# Patient Record
Sex: Male | Born: 1975 | Race: Black or African American | Hispanic: No | Marital: Single | Smoking: Never smoker
Health system: Southern US, Community
[De-identification: ages and names within clinical notes are randomized; demographics above are authoritative.]

---

## 2013-01-31 ENCOUNTER — Other Ambulatory Visit: Payer: Self-pay | Admitting: Infectious Disease

## 2013-01-31 ENCOUNTER — Ambulatory Visit
Admission: RE | Admit: 2013-01-31 | Discharge: 2013-01-31 | Disposition: A | Discharge status: Home / Self Care | Payer: No Typology Code available for payment source | Source: Ambulatory Visit | Attending: Infectious Disease | Admitting: Infectious Disease

## 2013-01-31 DIAGNOSIS — A150 Tuberculosis of lung: Secondary | ICD-10-CM

## 2016-03-26 ENCOUNTER — Ambulatory Visit (INDEPENDENT_AMBULATORY_CARE_PROVIDER_SITE_OTHER): Payer: Commercial Managed Care - PPO | Admitting: Physician Assistant

## 2016-03-26 VITALS — BP 112/73 | HR 72 | Temp 99.2°F | Resp 14 | Ht 67.0 in | Wt 144.0 lb

## 2016-03-26 DIAGNOSIS — M79602 Pain in left arm: Secondary | ICD-10-CM

## 2016-03-26 MED ORDER — ACETAMINOPHEN 500 MG PO TABS: 500.0000 mg | ORAL_TABLET | Freq: Four times a day (QID) | ORAL | 0 refills | Status: AC | PRN

## 2016-03-26 NOTE — Progress Notes (Signed)
Francis Perez  MRN: [id number] DOB: 11/02/1975  Subjective:  Francis Perez is a 41 y.o. male seen in office today for a chief complaint of intermittent left arm pain x 4 years. Denies acute injury, neck pain, numbness, tingling, weakness, and decreased ROM. Pain is maybe worsened with cold weather. He cannot think of any other aggravating symptoms. Notes that he will maybe have one episode per month that will last about half the day. He has never tried anything for the pain because it will just go away. He is not having the pain today. Of note he is a very active individual. He is always lifting/pushing/pulling things at work but notes working does not make it worse. He also does structured exercise and stretching regularly. He notes he can run 2 hours on treadmill with no issues. Denies history of exertional chest pain, SOB, nausea, vomiting, and diaphoresis.  Denies smoking.  Has no FM heart disease.  Review of Systems  Musculoskeletal: Negative for arthralgias, back pain, gait problem and myalgias.    There are no active problems to display for this patient.   No current outpatient prescriptions on file prior to visit.   No current facility-administered medications on file prior to visit.     Allergies not on file   Objective:  BP 112/73   Pulse 72   Temp 99.2 F (37.3 C) (Oral)   Resp 14   Ht 5\' 7"  (1.702 m)   Wt 144 lb (65.3 kg)   SpO2 100%   BMI 22.55 kg/m   Physical Exam  Constitutional: He is oriented to person, place, and time and well-developed, well-nourished, and in no distress.  HENT:  Head: Normocephalic and atraumatic.  Eyes: Conjunctivae are normal.  Neck: Normal range of motion and full passive range of motion without pain. No spinous process tenderness and no muscular tenderness present.  Cardiovascular: Normal rate, regular rhythm, normal heart sounds and intact distal pulses.   Pulmonary/Chest: Effort normal.  Musculoskeletal:       Left shoulder: Normal. He  exhibits normal range of motion, no tenderness, no bony tenderness, no swelling and no spasm.       Left elbow: Normal. He exhibits normal range of motion and no swelling. No tenderness found.       Left wrist: He exhibits normal range of motion, no tenderness, no bony tenderness and no swelling.       Cervical back: Normal. He exhibits normal range of motion, no tenderness and no bony tenderness.       Left upper arm: Normal. He exhibits no tenderness and no bony tenderness.       Left forearm: He exhibits no tenderness and no bony tenderness.       Left hand: He exhibits normal range of motion, no tenderness, no bony tenderness and normal capillary refill. Normal strength noted.  Neurological: He is alert and oriented to person, place, and time. He has normal strength. Gait normal.  Reflex Scores:      Tricep reflexes are 2+ on the right side and 2+ on the left side.      Bicep reflexes are 2+ on the right side and 2+ on the left side.      Brachioradialis reflexes are 2+ on the right side and 2+ on the left side. Skin: Skin is warm and dry.  Psychiatric: Affect normal.  Vitals reviewed.   Assessment and Plan :  1. Pain of left upper extremity PE findings are reassuring. Pt instructed  to keep an arm pain journal and document future incidents of arm and reporting what he is doing at the time, what the weather was like, any associated symptoms and bring that to his next visit as he could not provide a good thorough history today. Given him a Rx for tylenol to try next time he has pain to see if this helps. Encouraged to continue daily stretching. Instructed to return to clinic if symptoms worsen, do not improve, or as needed - acetaminophen (TYLENOL) 500 MG tablet; Take 1 tablet (500 mg total) by mouth every 6 (six) hours as needed.  Dispense: 30 tablet; Refill: 0   Benjiman Core PA-C  [facility] 03/26/2016 12:40 PM

## 2016-03-26 NOTE — Patient Instructions (Addendum)
For the couple of months, I would like you to keep a journal of when your arm pain appears. Note in the journal what is going on when the pain happens, what the weather is like, are there any other associated symptoms? Bring this in to your next visit. You can also try to take tylenol next time you are having this pain and see if it helps.   I think you should make a follow up appointment in 1-2 months for annual physical exam so we can recheck your blood work and make sure everything is good! Thank you!    IF you received an x-ray today, you will receive an [facility] from [facility]. Please contact [facility] at [phone] with questions or concerns regarding your invoice.   IF you received labwork today, you will receive an invoice from [facility]. Please contact [facility] at [phone] with questions or concerns regarding your invoice.   Our billing staff will not be able to assist you with questions regarding bills from these companies.  You will be contacted with the lab results as soon as they are available. The fastest way to get your results is to activate your My Chart account. Instructions are located on the last page of this paperwork. If you have not heard from us regarding the results in 2 weeks, please contact this office.

## 2016-07-22 ENCOUNTER — Encounter (HOSPITAL_COMMUNITY): Payer: Self-pay | Admitting: Emergency Medicine

## 2016-07-22 ENCOUNTER — Emergency Department (HOSPITAL_COMMUNITY)
Admission: EM | Admit: 2016-07-22 | Discharge: 2016-07-22 | Disposition: A | Discharge status: Home / Self Care | Payer: Commercial Managed Care - PPO | Attending: Emergency Medicine | Admitting: Emergency Medicine

## 2016-07-22 ENCOUNTER — Emergency Department (HOSPITAL_COMMUNITY): Payer: Commercial Managed Care - PPO

## 2016-07-22 DIAGNOSIS — M79602 Pain in left arm: Secondary | ICD-10-CM | POA: Diagnosis not present

## 2016-07-22 DIAGNOSIS — R079 Chest pain, unspecified: Secondary | ICD-10-CM

## 2016-07-22 DIAGNOSIS — R0789 Other chest pain: Secondary | ICD-10-CM | POA: Diagnosis not present

## 2016-07-22 DIAGNOSIS — M542 Cervicalgia: Secondary | ICD-10-CM | POA: Insufficient documentation

## 2016-07-22 LAB — CBC
HCT: 42 % (ref 39.0–52.0)
Hemoglobin: 14.4 g/dL (ref 13.0–17.0)
MCH: 30.5 pg (ref 26.0–34.0)
MCHC: 34.3 g/dL (ref 30.0–36.0)
MCV: 89 fL (ref 78.0–100.0)
PLATELETS: 285 10*3/uL (ref 150–400)
RBC: 4.72 MIL/uL (ref 4.22–5.81)
RDW: 12.7 % (ref 11.5–15.5)
WBC: 5.5 10*3/uL (ref 4.0–10.5)

## 2016-07-22 LAB — COMPREHENSIVE METABOLIC PANEL
ALT: 22 U/L (ref 17–63)
AST: 22 U/L (ref 15–41)
Albumin: 4.3 g/dL (ref 3.5–5.0)
Alkaline Phosphatase: 54 U/L (ref 38–126)
Anion gap: 8 (ref 5–15)
BUN: 11 mg/dL (ref 6–20)
CHLORIDE: 104 mmol/L (ref 101–111)
CO2: 27 mmol/L (ref 22–32)
CREATININE: 0.67 mg/dL (ref 0.61–1.24)
Calcium: 9.6 mg/dL (ref 8.9–10.3)
GFR calc non Af Amer: >60 mL/min (ref >60)
Glucose, Bld: 103 mg/dL — ABNORMAL HIGH (ref 65–99)
Potassium: 3.8 mmol/L (ref 3.5–5.1)
SODIUM: 139 mmol/L (ref 135–145)
Total Bilirubin: 0.7 mg/dL (ref 0.3–1.2)
Total Protein: 8.3 g/dL — ABNORMAL HIGH (ref 6.5–8.1)

## 2016-07-22 LAB — I-STAT TROPONIN, ED: Troponin i, poc: 0 ng/mL (ref 0.00–0.08)

## 2016-07-22 MED ORDER — ASPIRIN 81 MG PO CHEW: 324.0000 mg | CHEWABLE_TABLET | Freq: Once | ORAL | Status: DC

## 2016-07-22 NOTE — ED Provider Notes (Signed)
[facility]-EMERGENCY DEPT Provider Note   CSN: [id number] Arrival date & time: 07/22/16  1107     History   Chief Complaint Chief Complaint  Patient presents with  . Arm Pain  . Flank Pain    HPI Francis Perez is a 41 y.o. male.  HPI  41 year old male in no pertinent past medical history presents to the ED with 2 days of constant left-sided chest, left arm, left neck and face pain. Nonexertional. No associated nausea, vomiting, shortness of breath, diaphoresis. No aggravating or alleviating factors. Patient reports that he has felt this pain for 4 years and has been recurring since. Typically this pain lasts for 3-4 days and then spontaneously resolves. He denies any recent fevers, illnesses, infections, trauma. He does not do any heavy lifting. Denies any recent travels or surgeries.   History reviewed. No pertinent past medical history.  There are no active problems to display for this patient.   History reviewed. No pertinent surgical history.     Home Medications    Prior to Admission medications   Medication Sig Start Date End Date Taking? Authorizing Provider  acetaminophen (TYLENOL) 500 MG tablet Take 1 tablet (500 mg total) by mouth every 6 (six) hours as needed. Patient not taking: Reported on 07/22/2016 03/26/16   Magdalene River, PA-C    Family History History reviewed. No pertinent family history.  Social History Social History  Substance Use Topics  . Smoking status: Never Smoker  . Smokeless tobacco: Never Used  . Alcohol use Not on file     Allergies   Patient has no known allergies.   Review of Systems Review of Systems All other systems are reviewed and are negative for acute change except as noted in the HPI  Physical Exam Updated Vital Signs BP 122/70   Pulse 75   Temp 98.1 F (36.7 C) (Oral)   Resp (!) 24   SpO2 100%   Physical Exam  Constitutional: He is oriented to person, place, and time. He appears well-developed and  well-nourished. No distress.  HENT:  Head: Normocephalic and atraumatic.  Nose: Nose normal.  Eyes: Pupils are equal, round, and reactive to light. Conjunctivae and EOM are normal. Right eye exhibits no discharge. Left eye exhibits no discharge. No scleral icterus.  Neck: Normal range of motion. Neck supple.  Cardiovascular: Normal rate and regular rhythm.  Exam reveals no gallop and no friction rub.   No murmur heard. Pulmonary/Chest: Effort normal and breath sounds normal. No stridor. No respiratory distress. He has no rales.  Abdominal: Soft. He exhibits no distension. There is no tenderness.  Musculoskeletal: He exhibits no edema or tenderness.  Neurological: He is alert and oriented to person, place, and time.  Skin: Skin is warm and dry. No rash noted. He is not diaphoretic. No erythema.  Psychiatric: He has a normal mood and affect.  Vitals reviewed.    ED Treatments / Results  Labs (all labs ordered are listed, but only abnormal results are displayed) Labs Reviewed  COMPREHENSIVE METABOLIC PANEL - Abnormal; Notable for the following:       Result Value   Glucose, Bld 103 (*)    Total Protein 8.3 (*)    All other components within normal limits  CBC  I-STAT TROPONIN, ED    EKG  EKG Interpretation  Date/Time:  Thursday July 22 2016 12:18:05 EDT Ventricular Rate:  66 PR Interval:    QRS Duration: 120 QT Interval:  374 QTC Calculation: 392 R Axis:  65 Text Interpretation:  Sinus rhythm IVCD, consider atypical RBBB ST elev, probable normal early repol pattern No STEMI No old tracing to compare Confirmed by Drema Pryardama, Pedro [id number]) on 07/22/2016 12:51:16 PM       Radiology Dg Chest 2 View  Result Date: 07/22/2016 CLINICAL DATA:  Chest pain. EXAM: CHEST  2 VIEW COMPARISON:  01/31/2013 . FINDINGS: Mediastinum hilar structures normal. Lungs are clear. Heart size normal. No pleural effusion or pneumothorax. IMPRESSION: No acute cardiopulmonary disease. Electronically  Signed   By: Maisie Fushomas  Register   On: 07/22/2016 12:48    Procedures Procedures (including critical care time)  Medications Ordered in ED Medications  aspirin chewable tablet 324 mg (not administered)     Initial Impression / Assessment and Plan / ED Course  I have reviewed the triage vital signs and the nursing notes.  Pertinent labs & imaging results that were available during my care of the patient were reviewed by me and considered in my medical decision making (see chart for details).  Clinical Course as of Jul 22 1333  Thu Jul 22, 2016  1333 Atypical chest pain highly inconsistent with ACS. EKG without acute ischemic changes or evidence of pericarditis. Troponin negative. Given the duration of the patient's constant pain, I feel that the single troponin is sufficient to rule out ACS at this time.  Low pretest probability for pulmonary embolism and PERC negative. Presentation not classic for aortic dissection or esophageal perforation.  Chest x-ray without evidence suggestive of pneumonia, pneumothorax, pneumomediastinum.  No abnormal contour of the mediastinum to suggest dissection. No evidence of acute injuries.  The patient is safe for discharge with strict return precautions.   [PC]    Clinical Course User Index [PC] Cardama, Amadeo GarnetPedro Eduardo, MD      Final Clinical Impressions(s) / ED Diagnoses   Final diagnoses:  Chest pain  Left sided chest pain  Left arm pain  Neck pain on left side   Disposition: Discharge  Condition: Good  I have discussed the results, Dx and Tx plan with the patient who expressed understanding and agree(s) with the plan. Discharge instructions discussed at great length. The patient was given strict return precautions who verbalized understanding of the instructions. No further questions at time of discharge.    New Prescriptions   No medications on file    Follow Up: primary care provider   For help establishing care with a care  provider      Nira Connardama, Pedro Eduardo, MD 07/22/16 1336

## 2016-07-22 NOTE — ED Triage Notes (Signed)
Pt requests eval for left sided pain intermittently in legs arms, flank and back x weeks

## 2016-07-22 NOTE — ED Notes (Signed)
Patient transported to X-ray 

## 2017-07-28 ENCOUNTER — Ambulatory Visit: Payer: Commercial Managed Care - PPO | Admitting: Urgent Care

## 2017-07-28 DIAGNOSIS — Z01118 Encounter for examination of ears and hearing with other abnormal findings: Secondary | ICD-10-CM | POA: Diagnosis not present

## 2017-07-28 DIAGNOSIS — Z136 Encounter for screening for cardiovascular disorders: Secondary | ICD-10-CM | POA: Diagnosis not present

## 2017-07-28 DIAGNOSIS — Z131 Encounter for screening for diabetes mellitus: Secondary | ICD-10-CM | POA: Diagnosis not present

## 2017-07-28 DIAGNOSIS — Z0000 Encounter for general adult medical examination without abnormal findings: Secondary | ICD-10-CM | POA: Diagnosis not present

## 2018-11-12 IMAGING — DX DG CHEST 2V
2 series · 2 of 2 positions shown · non-contrast
Comparison: 01/31/2013 .

CLINICAL DATA: Chest pain.

EXAM:
CHEST  2 VIEW

[w chest pa]
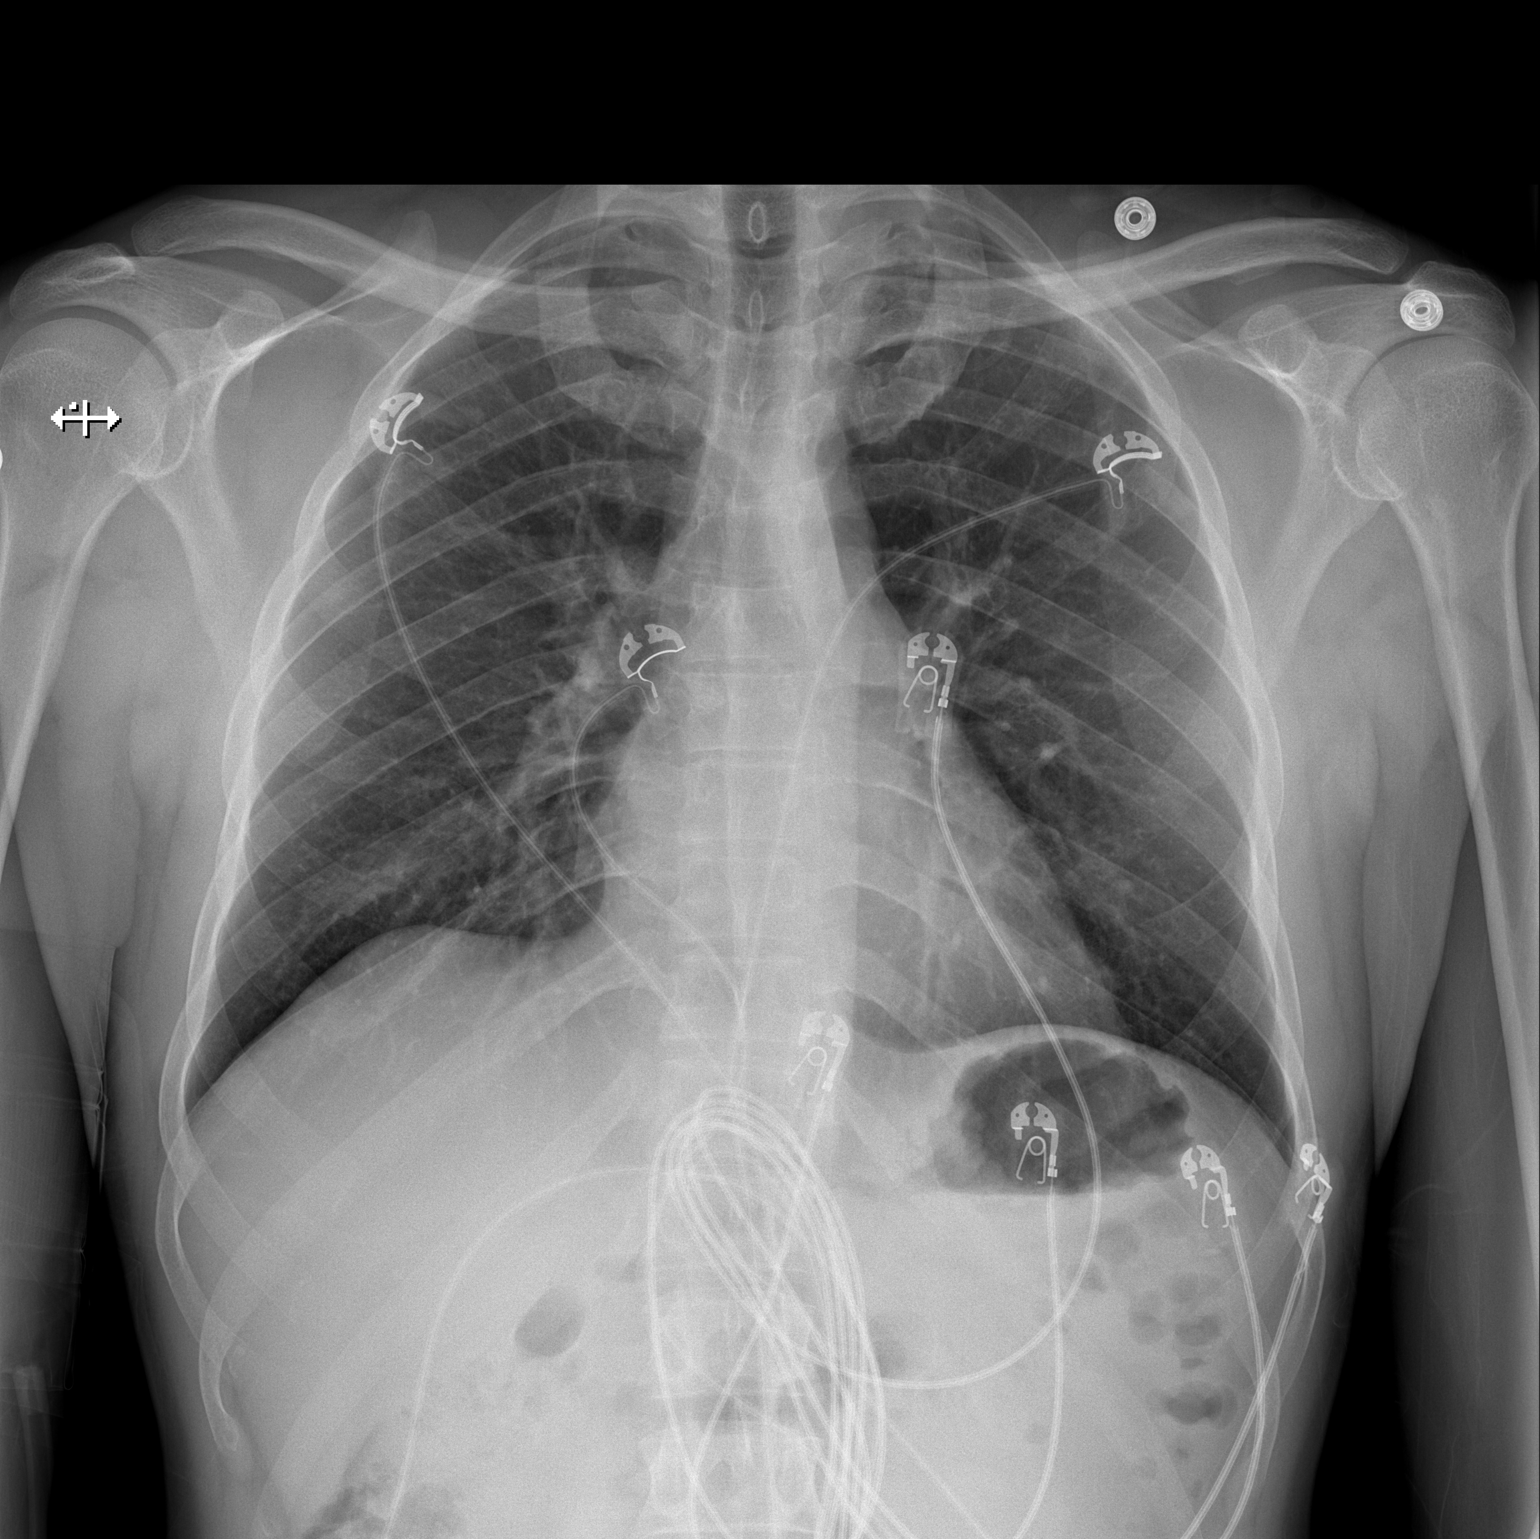

[w chest lat]
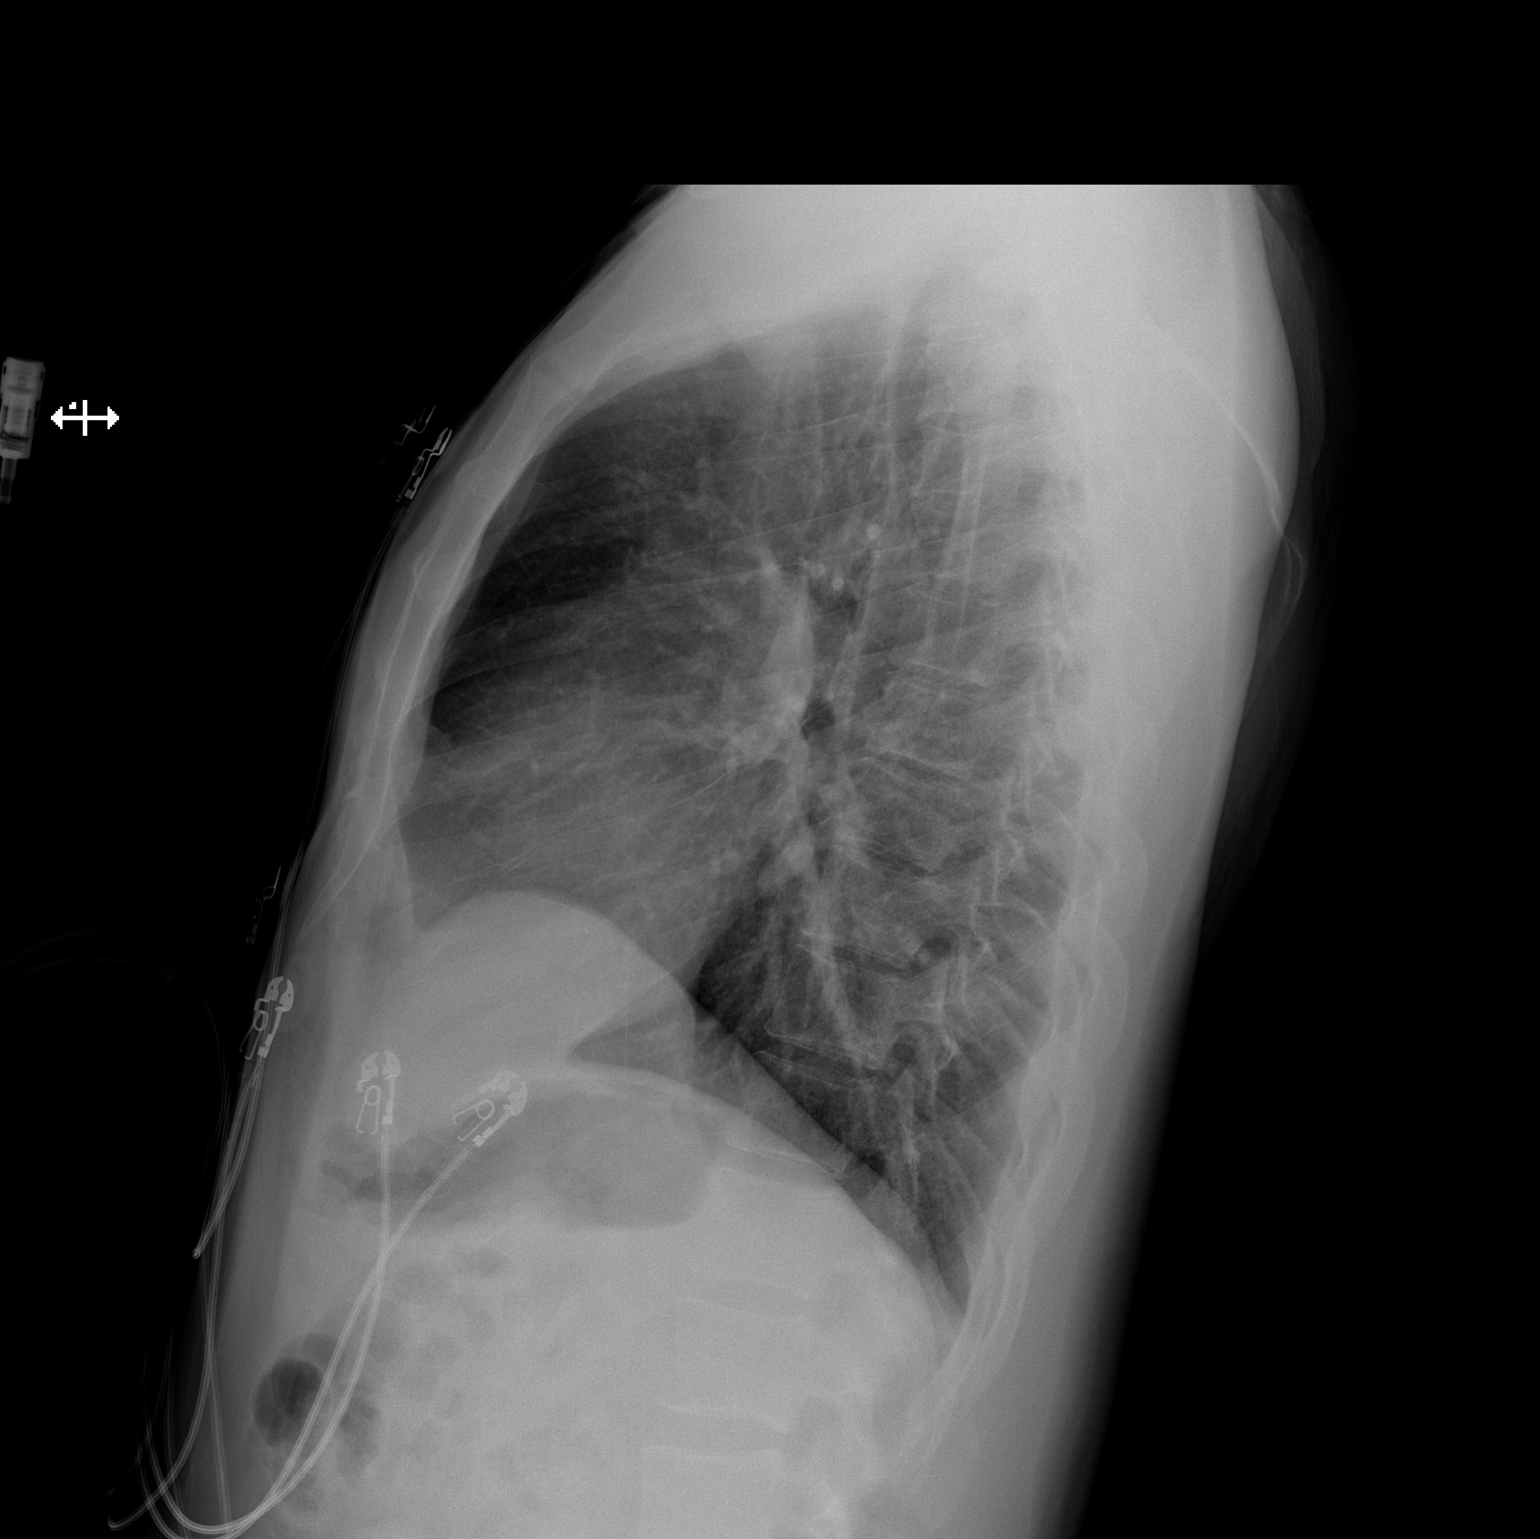

[2 of 2 positions shown; findings below may reference images not displayed]

FINDINGS: Mediastinum hilar structures normal. Lungs are clear. Heart size
normal. No pleural effusion or pneumothorax.
IMPRESSION: No acute cardiopulmonary disease.

## 2022-09-17 ENCOUNTER — Emergency Department (HOSPITAL_COMMUNITY): Payer: BLUE CROSS/BLUE SHIELD

## 2022-09-17 ENCOUNTER — Encounter (HOSPITAL_COMMUNITY): Payer: Self-pay

## 2022-09-17 ENCOUNTER — Emergency Department (HOSPITAL_COMMUNITY)
Admission: EM | Admit: 2022-09-17 | Discharge: 2022-09-17 | Disposition: A | Discharge status: Home / Self Care | Payer: BLUE CROSS/BLUE SHIELD | Attending: Emergency Medicine | Admitting: Emergency Medicine

## 2022-09-17 ENCOUNTER — Other Ambulatory Visit: Payer: Self-pay

## 2022-09-17 DIAGNOSIS — Y92481 Parking lot as the place of occurrence of the external cause: Secondary | ICD-10-CM | POA: Diagnosis not present

## 2022-09-17 DIAGNOSIS — R0789 Other chest pain: Secondary | ICD-10-CM | POA: Insufficient documentation

## 2022-09-17 LAB — BASIC METABOLIC PANEL
Anion gap: 10 (ref 5–15)
BUN: 8 mg/dL (ref 6–20)
CO2: 26 mmol/L (ref 22–32)
Calcium: 9.2 mg/dL (ref 8.9–10.3)
Chloride: 103 mmol/L (ref 98–111)
Creatinine, Ser: 0.76 mg/dL (ref 0.61–1.24)
GFR, Estimated: >60 mL/min (ref >60)
Glucose, Bld: 105 mg/dL — ABNORMAL HIGH (ref 70–99)
Potassium: 3.8 mmol/L (ref 3.5–5.1)
Sodium: 139 mmol/L (ref 135–145)

## 2022-09-17 LAB — CBC
HCT: 42.1 % (ref 39.0–52.0)
Hemoglobin: 14 g/dL (ref 13.0–17.0)
MCH: 30.3 pg (ref 26.0–34.0)
MCHC: 33.3 g/dL (ref 30.0–36.0)
MCV: 91.1 fL (ref 80.0–100.0)
Platelets: 242 10*3/uL (ref 150–400)
RBC: 4.62 MIL/uL (ref 4.22–5.81)
RDW: 13.2 % (ref 11.5–15.5)
WBC: 5.4 10*3/uL (ref 4.0–10.5)
nRBC: 0 % (ref 0.0–0.2)

## 2022-09-17 LAB — I-STAT CHEM 8, ED
BUN: 9 mg/dL (ref 6–20)
Calcium, Ion: 1.08 mmol/L — ABNORMAL LOW (ref 1.15–1.40)
Chloride: 105 mmol/L (ref 98–111)
Creatinine, Ser: 0.7 mg/dL (ref 0.61–1.24)
Glucose, Bld: 105 mg/dL — ABNORMAL HIGH (ref 70–99)
HCT: 45 % (ref 39.0–52.0)
Hemoglobin: 15.3 g/dL (ref 13.0–17.0)
Potassium: 3.9 mmol/L (ref 3.5–5.1)
Sodium: 139 mmol/L (ref 135–145)
TCO2: 25 mmol/L (ref 22–32)

## 2022-09-17 LAB — TROPONIN I (HIGH SENSITIVITY): Troponin I (High Sensitivity): 3 ng/L (ref <18)

## 2022-09-17 MED ORDER — LIDOCAINE 5 % EX PTCH: 1.0000 | MEDICATED_PATCH | CUTANEOUS | 0 refills | Status: AC

## 2022-09-17 MED ORDER — METHOCARBAMOL 500 MG PO TABS: 500.0000 mg | ORAL_TABLET | Freq: Two times a day (BID) | ORAL | 0 refills | Status: AC

## 2022-09-17 MED ORDER — ETODOLAC 400 MG PO TABS: 400.0000 mg | ORAL_TABLET | Freq: Two times a day (BID) | ORAL | 0 refills | Status: AC
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
